# Patient Record
Sex: Female | Born: 1990 | Race: Black or African American | Hispanic: No | Marital: Single | Smoking: Never smoker
Health system: Southern US, Community
[De-identification: ages and names within clinical notes are randomized; demographics above are authoritative.]

## PROBLEM LIST (undated history)

## (undated) DIAGNOSIS — C801 Malignant (primary) neoplasm, unspecified: Secondary | ICD-10-CM

## (undated) HISTORY — PX: SKIN BIOPSY: SHX1

---
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
## 2012-07-03 ENCOUNTER — Emergency Department (HOSPITAL_COMMUNITY)
Admission: EM | Admit: 2012-07-03 | Discharge: 2012-07-03 | Disposition: A | Discharge status: Home / Self Care | Payer: Federal, State, Local not specified - PPO | Attending: Emergency Medicine | Admitting: Emergency Medicine

## 2012-07-03 ENCOUNTER — Encounter (HOSPITAL_COMMUNITY): Payer: Self-pay

## 2012-07-03 DIAGNOSIS — R21 Rash and other nonspecific skin eruption: Secondary | ICD-10-CM | POA: Insufficient documentation

## 2012-07-03 DIAGNOSIS — R0789 Other chest pain: Secondary | ICD-10-CM | POA: Insufficient documentation

## 2012-07-03 DIAGNOSIS — Z85820 Personal history of malignant melanoma of skin: Secondary | ICD-10-CM | POA: Insufficient documentation

## 2012-07-03 DIAGNOSIS — R0602 Shortness of breath: Secondary | ICD-10-CM | POA: Insufficient documentation

## 2012-07-03 DIAGNOSIS — L272 Dermatitis due to ingested food: Secondary | ICD-10-CM | POA: Insufficient documentation

## 2012-07-03 DIAGNOSIS — T7840XA Allergy, unspecified, initial encounter: Secondary | ICD-10-CM

## 2012-07-03 HISTORY — DX: Malignant (primary) neoplasm, unspecified: C80.1

## 2012-07-03 MED ORDER — DIPHENHYDRAMINE HCL 50 MG/ML IJ SOLN
25.0000 mg | Freq: Once | INTRAMUSCULAR | Status: DC
  Filled 2012-07-03: qty 1

## 2012-07-03 MED ORDER — DIPHENHYDRAMINE HCL 50 MG/ML IJ SOLN
50.0000 mg | Freq: Once | INTRAMUSCULAR | Status: AC
  Administered 2012-07-03: 50 mg via INTRAVENOUS

## 2012-07-03 MED ORDER — EPINEPHRINE 0.3 MG/0.3ML IJ DEVI
0.3000 mg | Freq: Once | INTRAMUSCULAR | Status: AC
  Administered 2012-07-03: 0.3 mg via INTRAMUSCULAR
  Filled 2012-07-03: qty 0.3

## 2012-07-03 MED ORDER — FAMOTIDINE IN NACL 20-0.9 MG/50ML-% IV SOLN
20.0000 mg | Freq: Once | INTRAVENOUS | Status: AC
  Administered 2012-07-03: 20 mg via INTRAVENOUS
  Filled 2012-07-03: qty 50

## 2012-07-03 MED ORDER — PREDNISONE 20 MG PO TABS: 40.0000 mg | ORAL_TABLET | Freq: Every day | ORAL | Status: DC

## 2012-07-03 MED ORDER — DIPHENHYDRAMINE HCL 25 MG PO TABS: 25.0000 mg | ORAL_TABLET | Freq: Four times a day (QID) | ORAL | Status: AC

## 2012-07-03 MED ORDER — METHYLPREDNISOLONE SODIUM SUCC 125 MG IJ SOLR
125.0000 mg | Freq: Once | INTRAMUSCULAR | Status: AC
  Administered 2012-07-03: 125 mg via INTRAVENOUS
  Filled 2012-07-03: qty 2

## 2012-07-03 MED ORDER — ONDANSETRON HCL 4 MG/2ML IJ SOLN
4.0000 mg | Freq: Once | INTRAMUSCULAR | Status: AC
  Administered 2012-07-03: 4 mg via INTRAVENOUS
  Filled 2012-07-03: qty 2

## 2012-07-03 NOTE — ED Provider Notes (Signed)
History     CSN: [id number]  Arrival date & time 07/03/12  2040   First MD Initiated Contact with Patient 07/03/12 2045      Chief Complaint  Patient presents with  . Allergic Reaction    (Consider location/radiation/quality/duration/timing/severity/associated sxs/prior treatment) HPI Comments: Patient with history of allergic reaction to peanuts presents with complaint of shortness of breath and tightness in her throat. Patient states that she ate [place] food that contained peanuts last night and she had subsequent hives. She did not have any trouble breathing at that time. She treated herself at home with Benadryl. She continued to feel bad this morning and went to [facility] which gave her additional antihistamines. This evening her breathing became worse and she felt that she needed to come to the hospital for evaluation. She did not use her EpiPen. She has not had any syncopal episodes, vomiting, diarrhea. Her hives are resolved. She has had similar symptoms in the past. Onset acute. Course was gradually worsening. Nothing makes symptoms better or worse.  The history is provided by the patient.    Past Medical History  Diagnosis Date  . Cancer     melanoma    Past Surgical History  Procedure Date  . Skin biopsy     History reviewed. No pertinent family history.  History  Substance Use Topics  . Smoking status: Never Smoker   . Smokeless tobacco: Not on file  . Alcohol Use: Yes    OB History    Grav Para Term Preterm Abortions TAB SAB Ect Mult Living                  Review of Systems  Constitutional: Negative for fever.  HENT: Negative for sore throat, facial swelling, rhinorrhea and trouble swallowing.   Eyes: Negative for redness.  Respiratory: Positive for chest tightness and shortness of breath. Negative for cough, wheezing and stridor.   Cardiovascular: Negative for chest pain.  Gastrointestinal: Negative for nausea, vomiting, abdominal pain and  diarrhea.  Genitourinary: Negative for dysuria.  Musculoskeletal: Negative for myalgias.  Skin: Positive for rash.  Neurological: Negative for light-headedness and headaches.  Psychiatric/Behavioral: Negative for confusion.    Allergies  Peanuts and Soybean-containing drug products  Home Medications   Current Outpatient Rx  Name  Route  Sig  Dispense  Refill  . DIPHENHYDRAMINE HCL (SLEEP) 25 MG PO TABS   Oral   Take 25 mg by mouth at bedtime as needed. For allergies           BP 142/71  Pulse 110  Temp 98 F (36.7 C) (Oral)  Resp 16  SpO2 100%  Physical Exam  Nursing note and vitals reviewed. Constitutional: She appears well-developed and well-nourished.  HENT:  Head: Normocephalic and atraumatic.       No overt swelling of tongue or airway  Eyes: Conjunctivae normal are normal. Right eye exhibits no discharge. Left eye exhibits no discharge.  Neck: Normal range of motion. Neck supple.  Cardiovascular: Normal rate, regular rhythm and normal heart sounds.   Pulmonary/Chest: Breath sounds normal. No stridor. She has no wheezes.       Tachypnea. 2-3 word dyspnea.   Abdominal: Soft. There is no tenderness.  Neurological: She is alert.  Skin: Skin is warm and dry.  Psychiatric: She has a normal mood and affect.    ED Course  Procedures (including critical care time)  Labs Reviewed - No data to display No results found.   1. Allergic  reaction     8:52 PM Patient seen and examined. Patient d/w and seen by Dr. Blinda Leatherwood. Will give epi, antihistamines, solumedrol.    Vital signs reviewed and are as follows: Filed Vitals:   07/03/12 2046  BP: 142/71  Pulse: 110  Temp: 98 F (36.7 C)  Resp: 16   9:25 PM Patient feeling improved after administration of medications.   11:07 PM Patient is at baseline and has remained so. Normal exam. She is comfortable with discharge. Discussed discharge with Dr. Blinda Leatherwood.   Urged return with worsening symptoms, trouble  breathing, shortness of breath or any other concerns. Patient has EpiPen.  MDM  Allergic reaction, questionable anaphylaxis. Patient seemed very anxious and was breathing well. She was given epinephrine and had improvement in symptoms. She is back to baseline. Observed in ED for 2+ hrs. Patient d/w Dr. Blinda Leatherwood prior to discharge.         Renne Crigler, [place] 07/03/12 [phone]

## 2012-07-03 NOTE — ED Notes (Signed)
Pt states she ingested peanuts last night, was taking benadryl, no relief. Pt states her chest became tight and is having trouble breathing. ABCs intact. Pt hyperventilating in waiting room. Pt able to speak in complete sentences.

## 2012-07-03 NOTE — ED Provider Notes (Signed)
Medical screening examination/treatment/procedure(s) were performed by non-physician practitioner and as supervising physician I was immediately available for consultation/collaboration.   Gilda Crease, MD 07/03/12 [phone]

## 2012-07-03 NOTE — ED Notes (Signed)
Pt respirations are now even and unlabored. o2 sats at 100%

## 2012-07-06 ENCOUNTER — Emergency Department (HOSPITAL_COMMUNITY)
Admission: EM | Admit: 2012-07-06 | Discharge: 2012-07-06 | Disposition: A | Discharge status: Home / Self Care | Payer: Federal, State, Local not specified - PPO | Source: Home / Self Care

## 2012-07-06 ENCOUNTER — Encounter (HOSPITAL_COMMUNITY): Payer: Self-pay | Admitting: *Deleted

## 2012-07-06 DIAGNOSIS — T7840XA Allergy, unspecified, initial encounter: Secondary | ICD-10-CM

## 2012-07-06 MED ORDER — CETIRIZINE HCL 10 MG PO TABS: 10.0000 mg | ORAL_TABLET | Freq: Every day | ORAL | Status: AC

## 2012-07-06 MED ORDER — EPINEPHRINE 0.3 MG/0.3ML IJ DEVI: 0.3000 mg | Freq: Once | INTRAMUSCULAR | Status: DC

## 2012-07-06 NOTE — ED Provider Notes (Signed)
Medical screening examination/treatment/procedure(s) were performed by a resident physician and as supervising physician I was immediately available for consultation/collaboration.  Additionally, I saw the patient independently, verified the history, examined the patient and discussed the treatment plan with the resident.  Leslee Home, M.D.    Reuben Likes, MD 07/06/12 [phone]

## 2012-07-06 NOTE — ED Provider Notes (Signed)
History     CSN: [id number]  Arrival date & time 07/06/12  1450 Chief Complaint  Patient presents with  . Allergic Reaction   HPI Micah Flesher to ED on Tuesday after eating [place] Food on Monday and getting hives afterwards without and shortness of breath, tongue or throat swelling ast first. Patient has peanut allergy and food may have contained peanuts though she is not sure.  Patient took benadryl for about a day but ran out and began to feel some tightness in throat and tongue swelling. Patient states her epi pen was expired and she didn't use it. In the ED, given epinephrine, steroid injection, benadryl. Has been on 5 day course of prednisone and benadryl which is to be completed tomorrow.   Patient has been symptom free since starting medication (no chest pain/tightness, tongue or throat swelling, hives). Presents for follow up as instructed by EDP.   Past Medical History  Diagnosis Date  . Cancer     melanoma    Past Surgical History  Procedure Date  . Skin biopsy    Family history-noncontributory  History  Substance Use Topics  . Smoking status: Never Smoker   . Smokeless tobacco: Not on file  . Alcohol Use: Yes    Review of Systems  Constitutional: Negative for chills, activity change and fatigue.  HENT: Negative for hearing loss, ear pain, congestion, facial swelling, rhinorrhea, sneezing, neck pain and neck stiffness.   Eyes: Negative for pain, discharge and itching.  Respiratory: Negative for cough, choking, chest tightness and shortness of breath.   Cardiovascular: Negative for chest pain and palpitations.  Gastrointestinal: Negative for abdominal pain and abdominal distention.  Genitourinary: Negative for dysuria and flank pain.  Musculoskeletal: Negative for back pain and arthralgias.  Skin: Negative for color change, pallor and rash (no more hives).  Neurological: Negative for dizziness and headaches.  Hematological: Negative for adenopathy. Does not bruise/bleed  easily.  Psychiatric/Behavioral: Negative for confusion and agitation.    Allergies  Peanuts and Soybean-containing drug products  Home Medications   Current Outpatient Rx  Name  Route  Sig  Dispense  Refill  . CETIRIZINE HCL 10 MG PO TABS   Oral   Take 1 tablet (10 mg total) by mouth daily.   30 tablet   0   . DIPHENHYDRAMINE HCL 25 MG PO TABS   Oral   Take 1 tablet (25 mg total) by mouth every 6 (six) hours.   20 tablet   0   . EPINEPHRINE 0.3 MG/0.3ML IJ DEVI   Intramuscular   Inject 0.3 mLs (0.3 mg total) into the muscle once.   2 Device   1   . PREDNISONE 20 MG PO TABS   Oral   Take 2 tablets (40 mg total) by mouth daily.   8 tablet   0     BP 125/63  Pulse 90  Temp 98.3 F (36.8 C) (Oral)  Resp 18  SpO2 99%  LMP 06/30/2012  Physical Exam  Constitutional: She is oriented to person, place, and time. She appears well-developed and well-nourished.  HENT:  Head: Normocephalic and atraumatic.  Mouth/Throat: No oropharyngeal exudate.       No tongue or throat swelling   Eyes: Conjunctivae normal and EOM are normal. Pupils are equal, round, and reactive to light.  Neck: Normal range of motion. Neck supple.  Cardiovascular: Normal rate and regular rhythm.  Exam reveals no gallop and no friction rub.   No murmur heard. Pulmonary/Chest: Effort normal  and breath sounds normal. She has no wheezes. She has no rales.  Abdominal: Soft. Bowel sounds are normal.  Musculoskeletal: Normal range of motion. She exhibits no edema.  Neurological: She is alert and oriented to person, place, and time.  Skin: Skin is warm and dry.    ED Course  Procedures (including critical care time)  Labs Reviewed - No data to display No results found.   1. Allergic reaction    MDM  Symptoms have resolved on prednisone taper. Patient last dose prednisone tomorrow. Advised patient of reasons to return/return of symptoms. Refilled EpiPen. ALso given rx for cetirizine which can  replace benadryl for next week to allow her to be more awake with her studies at [employer].   Dr. Lorenz Coaster has seen and evaluated the patient. We have discussed the history, exam, assessment, and plan as noted above. He agrees with management.   Aldine Contes. Marti Sleigh, MD, PGY2 07/06/2012 7:11 PM      Shelva Majestic, MD 07/06/12 1911

## 2012-07-06 NOTE — ED Notes (Signed)
Pt  Reports     She  Had  An allergic  Reaction  sev  Days  Ago      Was  Seen  In  Er  At that  Time      She  Reports  The   Symptoms  Are  Better   yest  She  Reports  Some  Soreness  In  Chest       At  This  Time       She  Is  Here  Today  fopr a  followup      She  Is  Awake  Alert and  Is  In no  Severe  Distress

## 2012-07-11 ENCOUNTER — Emergency Department (HOSPITAL_COMMUNITY)
Admission: EM | Admit: 2012-07-11 | Discharge: 2012-07-11 | Disposition: A | Discharge status: Home / Self Care | Payer: Federal, State, Local not specified - PPO | Source: Home / Self Care | Attending: Family Medicine | Admitting: Family Medicine

## 2012-07-11 ENCOUNTER — Encounter (HOSPITAL_COMMUNITY): Payer: Self-pay | Admitting: Emergency Medicine

## 2012-07-11 DIAGNOSIS — K529 Noninfective gastroenteritis and colitis, unspecified: Secondary | ICD-10-CM

## 2012-07-11 DIAGNOSIS — K5289 Other specified noninfective gastroenteritis and colitis: Secondary | ICD-10-CM

## 2012-07-11 MED ORDER — ONDANSETRON 4 MG PO TBDP
ORAL_TABLET | ORAL | Status: AC
  Filled 2012-07-11: qty 2

## 2012-07-11 MED ORDER — ONDANSETRON 4 MG PO TBDP
8.0000 mg | ORAL_TABLET | Freq: Once | ORAL | Status: AC
  Administered 2012-07-11: 8 mg via ORAL

## 2012-07-11 MED ORDER — GI COCKTAIL ~~LOC~~
30.0000 mL | Freq: Once | ORAL | Status: AC
  Administered 2012-07-11: 30 mL via ORAL

## 2012-07-11 MED ORDER — ONDANSETRON HCL 4 MG PO TABS: 4.0000 mg | ORAL_TABLET | Freq: Four times a day (QID) | ORAL | Status: AC

## 2012-07-11 MED ORDER — GI COCKTAIL ~~LOC~~
ORAL | Status: AC
  Filled 2012-07-11: qty 30

## 2012-07-11 NOTE — ED Notes (Addendum)
Pt is here for poss allergic reaction to meds Was seen at the [facility] ER for allergic reaction to peanuts Was seen here at the [facility] for a f/u on 07/06/12 Was given prednisone and benadryl Pt today reports having episodes since last night of feeling nauseas and sweating Today she has been vomiting; last emesis was around 1400 Had 2 epsiodes of n/v/and sweating today; yest she had 7; will last for 15 minutes Sx include: diarrhea as well, loss of appetite, pressure on chest Denies: SOB, swelling of throat  She is alert w/no signs of acute dsitress

## 2012-07-11 NOTE — ED Provider Notes (Signed)
History     CSN: [id number]  Arrival date & time 07/11/12  1831   First MD Initiated Contact with Patient 07/11/12 1833      Chief Complaint  Patient presents with  . Allergic Reaction    (Consider location/radiation/quality/duration/timing/severity/associated sxs/prior treatment) Patient is a 22 y.o. female presenting with vomiting. The history is provided by the patient.  Emesis  This is a new problem. The current episode started yesterday. The problem occurs 2 to 4 times per day. The problem has been gradually improving (last vomited at 2pm today.also with diarrhea.). There has been no fever. Associated symptoms include diarrhea. Pertinent negatives include no abdominal pain, no chills and no fever.    Past Medical History  Diagnosis Date  . Cancer     melanoma    Past Surgical History  Procedure Date  . Skin biopsy     No family history on file.  History  Substance Use Topics  . Smoking status: Never Smoker   . Smokeless tobacco: Not on file  . Alcohol Use: Yes    OB History    Grav Para Term Preterm Abortions TAB SAB Ect Mult Living                  Review of Systems  Constitutional: Negative for fever and chills.  Gastrointestinal: Positive for vomiting and diarrhea. Negative for abdominal pain.    Allergies  Peanuts and Soybean-containing drug products  Home Medications   Current Outpatient Rx  Name  Route  Sig  Dispense  Refill  . DIPHENHYDRAMINE HCL 25 MG PO TABS   Oral   Take 1 tablet (25 mg total) by mouth every 6 (six) hours.   20 tablet   0   . PREDNISONE 20 MG PO TABS   Oral   Take 2 tablets (40 mg total) by mouth daily.   8 tablet   0   . CETIRIZINE HCL 10 MG PO TABS   Oral   Take 1 tablet (10 mg total) by mouth daily.   30 tablet   0   . EPINEPHRINE 0.3 MG/0.3ML IJ DEVI   Intramuscular   Inject 0.3 mLs (0.3 mg total) into the muscle once.   2 Device   1   . ONDANSETRON HCL 4 MG PO TABS   Oral   Take 1 tablet (4 mg  total) by mouth every 6 (six) hours. For n/v   6 tablet   0     BP 119/74  Pulse 98  Temp 98.3 F (36.8 C) (Oral)  Resp 18  SpO2 98%  LMP 06/30/2012  Physical Exam  Nursing note and vitals reviewed. Constitutional: She is oriented to person, place, and time. She appears well-developed and well-nourished.  HENT:  Mouth/Throat: Oropharynx is clear and moist.  Eyes: Conjunctivae normal are normal. Pupils are equal, round, and reactive to light.  Neck: Normal range of motion. Neck supple.  Cardiovascular: Regular rhythm.   Pulmonary/Chest: Breath sounds normal.  Abdominal: Soft. Bowel sounds are normal.  Lymphadenopathy:    She has no cervical adenopathy.  Neurological: She is alert and oriented to person, place, and time.  Skin: Skin is warm and dry.    ED Course  Procedures (including critical care time)  Labs Reviewed - No data to display No results found.   1. Acute gastroenteritis       MDM          Linna Hoff, MD 07/11/12 2019

## 2013-08-14 ENCOUNTER — Emergency Department (HOSPITAL_COMMUNITY)
Admission: EM | Admit: 2013-08-14 | Discharge: 2013-08-14 | Disposition: A | Discharge status: Home / Self Care | Payer: Federal, State, Local not specified - PPO | Attending: Emergency Medicine | Admitting: Emergency Medicine

## 2013-08-14 ENCOUNTER — Encounter (HOSPITAL_COMMUNITY): Payer: Self-pay | Admitting: Emergency Medicine

## 2013-08-14 DIAGNOSIS — R062 Wheezing: Secondary | ICD-10-CM | POA: Insufficient documentation

## 2013-08-14 DIAGNOSIS — Z79899 Other long term (current) drug therapy: Secondary | ICD-10-CM | POA: Insufficient documentation

## 2013-08-14 DIAGNOSIS — L299 Pruritus, unspecified: Secondary | ICD-10-CM | POA: Insufficient documentation

## 2013-08-14 DIAGNOSIS — Z85820 Personal history of malignant melanoma of skin: Secondary | ICD-10-CM | POA: Insufficient documentation

## 2013-08-14 DIAGNOSIS — T4995XA Adverse effect of unspecified topical agent, initial encounter: Secondary | ICD-10-CM | POA: Insufficient documentation

## 2013-08-14 DIAGNOSIS — T7840XA Allergy, unspecified, initial encounter: Secondary | ICD-10-CM

## 2013-08-14 MED ORDER — FAMOTIDINE IN NACL 20-0.9 MG/50ML-% IV SOLN
20.0000 mg | Freq: Once | INTRAVENOUS | Status: AC
  Administered 2013-08-14: 20 mg via INTRAVENOUS
  Filled 2013-08-14: qty 50

## 2013-08-14 MED ORDER — DIPHENHYDRAMINE HCL 50 MG/ML IJ SOLN
25.0000 mg | Freq: Once | INTRAMUSCULAR | Status: AC
  Administered 2013-08-14: 25 mg via INTRAVENOUS
  Filled 2013-08-14: qty 1

## 2013-08-14 MED ORDER — SODIUM CHLORIDE 0.9 % IV BOLUS (SEPSIS)
1000.0000 mL | Freq: Once | INTRAVENOUS | Status: AC
  Administered 2013-08-14: 1000 mL via INTRAVENOUS

## 2013-08-14 MED ORDER — EPINEPHRINE 0.3 MG/0.3ML IJ SOAJ
INTRAMUSCULAR | Status: AC
  Filled 2013-08-14: qty 0.3

## 2013-08-14 MED ORDER — METHYLPREDNISOLONE SODIUM SUCC 125 MG IJ SOLR
125.0000 mg | Freq: Once | INTRAMUSCULAR | Status: AC
  Administered 2013-08-14: 125 mg via INTRAVENOUS
  Filled 2013-08-14: qty 2

## 2013-08-14 MED ORDER — FAMOTIDINE 20 MG PO TABS: 20.0000 mg | ORAL_TABLET | Freq: Two times a day (BID) | ORAL | Status: DC

## 2013-08-14 MED ORDER — PREDNISONE 50 MG PO TABS: 50.0000 mg | ORAL_TABLET | Freq: Every day | ORAL | Status: DC

## 2013-08-14 NOTE — ED Notes (Signed)
Friend stated, A friend opened a bag of peanuts and she is allergic severely of peanuts.  Started breathing heavy and becoming SOB.  Pt. Breathing hard with big deep breaths. Given Benadryl 25mg . About 30 minutes ago.

## 2013-08-14 NOTE — ED Provider Notes (Signed)
CSN: [id number]     Arrival date & time 08/14/13  1755 History   First MD Initiated Contact with Patient 08/14/13 1839     Chief Complaint  Patient presents with  . Shortness of Breath  . Allergic Reaction     (Consider location/radiation/quality/duration/timing/severity/associated sxs/prior Treatment) HPI Patient presents to the emergency department with possible allergic reaction to peanut percent, which the patient, states she has appeared, allergy, and a coworker Z. appear butter sandwich and when she got around a coworker she started having heavy breathing, and felt like her face was itchy patient, states, that she took Benadryl prior to arrival.  Patient did not contact her eating peanuts accident.  She denies chest pain, shortness of breath, nausea, vomiting, diarrhea, back pain, neck swelling, fever, weakness, dizziness, throat, swelling, or rash. Past Medical History  Diagnosis Date  . Cancer     melanoma   Past Surgical History  Procedure Laterality Date  . Skin biopsy     No family history on file. History  Substance Use Topics  . Smoking status: Never Smoker   . Smokeless tobacco: Not on file  . Alcohol Use: Yes   OB History   Grav Para Term Preterm Abortions TAB SAB Ect Mult Living                 Review of Systems  All other systems negative except as documented in the HPI. All pertinent positives and negatives as reviewed in the HPI.  Allergies  Peanuts and Soybean-containing drug products  Home Medications   Current Outpatient Rx  Name  Route  Sig  Dispense  Refill  . cetirizine (ZYRTEC) 10 MG tablet   Oral   Take 1 tablet (10 mg total) by mouth daily.   30 tablet   0   . diphenhydrAMINE (BENADRYL) 25 MG tablet   Oral   Take 1 tablet (25 mg total) by mouth every 6 (six) hours.   20 tablet   0   . EPINEPHrine (EPI-PEN) 0.3 mg/0.3 mL DEVI   Intramuscular   Inject 0.3 mLs (0.3 mg total) into the muscle once.   2 Device   1   . ondansetron  (ZOFRAN) 4 MG tablet   Oral   Take 1 tablet (4 mg total) by mouth every 6 (six) hours. For n/v   6 tablet   0    BP 115/59  Pulse 104  SpO2 100%  LMP 08/07/2013 Physical Exam  Nursing note and vitals reviewed. Constitutional: She is oriented to person, place, and time. She appears well-developed and well-nourished.  HENT:  Head: Normocephalic and atraumatic.  Mouth/Throat: Uvula is midline and oropharynx is clear and moist. No trismus in the jaw. No uvula swelling.  Eyes: Pupils are equal, round, and reactive to light.  Neck: Normal range of motion. Neck supple.  Cardiovascular: Normal rate, regular rhythm and normal heart sounds.  Exam reveals no gallop and no friction rub.   No murmur heard. Pulmonary/Chest: Effort normal and breath sounds normal.  Neurological: She is alert and oriented to person, place, and time. She exhibits normal muscle tone. Coordination normal.  Skin: Skin is warm and dry.    ED Course  Procedures (including critical care time) Patient is able to speak in full sentences without difficulty.  The patient is a have any wheezing.  On exam.  Her throat is now swollen.  Uvula is midline, without swelling.  I did order the patient.  Solu-Medrol, Benadryl, and Pepcid. The  patient ambulated to the bathroom without difficulty.  Patient, hyperventilating on arrival and making of upper respiratory sounds.  There is no actual wheezing noted.  Patient is feeling totally resolved. Will discharge and will have her return here as needed.   Brent General, PA-C 08/14/13 2034

## 2013-08-14 NOTE — ED Notes (Signed)
Pt states she was eating near someone eating and peanut butter and jelly when she began feeling like she was having an allergic reaction. Pt appears anxious and with exaggerated breathing when talking about allergic reaction. Breath sounds clear, O2 sats 100% on room air.

## 2013-08-14 NOTE — Discharge Instructions (Signed)
Return here as needed. Follow up with your doctor. °

## 2013-08-14 NOTE — ED Provider Notes (Signed)
Medical screening examination/treatment/procedure(s) were performed by non-physician practitioner and as supervising physician I was immediately available for consultation/collaboration.   EKG Interpretation None        Wandra Arthurs, MD 08/14/13 2059

## 2014-04-09 ENCOUNTER — Emergency Department (HOSPITAL_COMMUNITY)
Admission: EM | Admit: 2014-04-09 | Discharge: 2014-04-09 | Disposition: A | Discharge status: Home / Self Care | Payer: Federal, State, Local not specified - PPO | Attending: Emergency Medicine | Admitting: Emergency Medicine

## 2014-04-09 ENCOUNTER — Encounter (HOSPITAL_COMMUNITY): Payer: Self-pay

## 2014-04-09 DIAGNOSIS — Z79899 Other long term (current) drug therapy: Secondary | ICD-10-CM | POA: Insufficient documentation

## 2014-04-09 DIAGNOSIS — Z7952 Long term (current) use of systemic steroids: Secondary | ICD-10-CM | POA: Diagnosis not present

## 2014-04-09 DIAGNOSIS — T7840XA Allergy, unspecified, initial encounter: Secondary | ICD-10-CM

## 2014-04-09 DIAGNOSIS — R0602 Shortness of breath: Secondary | ICD-10-CM | POA: Insufficient documentation

## 2014-04-09 MED ORDER — EPINEPHRINE 0.3 MG/0.3ML IJ SOAJ
0.3000 mg | Freq: Once | INTRAMUSCULAR | Status: AC
  Administered 2014-04-09: 0.3 mg via INTRAMUSCULAR

## 2014-04-09 MED ORDER — DIPHENHYDRAMINE HCL 50 MG/ML IJ SOLN
INTRAMUSCULAR | Status: AC
  Filled 2014-04-09: qty 1

## 2014-04-09 MED ORDER — METHYLPREDNISOLONE SODIUM SUCC 125 MG IJ SOLR
INTRAMUSCULAR | Status: AC
  Filled 2014-04-09: qty 2

## 2014-04-09 MED ORDER — FAMOTIDINE IN NACL 20-0.9 MG/50ML-% IV SOLN: 20.0000 mg | Freq: Once | INTRAVENOUS | Status: DC

## 2014-04-09 MED ORDER — EPINEPHRINE 0.3 MG/0.3ML IJ DEVI: 0.3000 mg | Freq: Once | INTRAMUSCULAR | Status: AC

## 2014-04-09 MED ORDER — PREDNISONE 50 MG PO TABS: 50.0000 mg | ORAL_TABLET | Freq: Every day | ORAL | Status: AC

## 2014-04-09 MED ORDER — FAMOTIDINE IN NACL 20-0.9 MG/50ML-% IV SOLN
20.0000 mg | Freq: Once | INTRAVENOUS | Status: AC
  Administered 2014-04-09: 20 mg via INTRAVENOUS
  Filled 2014-04-09: qty 50

## 2014-04-09 MED ORDER — DIPHENHYDRAMINE HCL 50 MG/ML IJ SOLN
50.0000 mg | Freq: Once | INTRAMUSCULAR | Status: AC
  Administered 2014-04-09: 50 mg via INTRAVENOUS

## 2014-04-09 MED ORDER — METHYLPREDNISOLONE SODIUM SUCC 125 MG IJ SOLR
125.0000 mg | Freq: Once | INTRAMUSCULAR | Status: AC
Start: 2014-04-09 — End: 2014-04-09
  Administered 2014-04-09: 125 mg via INTRAVENOUS

## 2014-04-09 MED ORDER — EPINEPHRINE 0.3 MG/0.3ML IJ SOAJ
INTRAMUSCULAR | Status: AC
  Filled 2014-04-09: qty 0.3

## 2014-04-09 NOTE — Discharge Instructions (Signed)

## 2014-04-09 NOTE — ED Notes (Signed)
MD at bedside. 

## 2014-04-09 NOTE — ED Provider Notes (Signed)
CSN: [id number]     Arrival date & time 04/09/14  1839 History   First MD Initiated Contact with Patient 04/09/14 1845     Chief Complaint  Patient presents with  . Allergic Reaction     (Consider location/radiation/quality/duration/timing/severity/associated sxs/prior Treatment) HPI Comments: Patient here complaining of sudden onset of shortness of breath that started after she was exposed to peanuts. She does have a known allergy to all peanuts and legumes. She took an unknown amount of Benadryl and continues to have wheezing as well as stridor. No rash noted. Called EMS and transported here.  Patient is a 23 y.o. female presenting with allergic reaction. The history is provided by the patient and the EMS personnel.  Allergic Reaction   Past Medical History  Diagnosis Date  . Cancer     melanoma   Past Surgical History  Procedure Laterality Date  . Skin biopsy     No family history on file. History  Substance Use Topics  . Smoking status: Never Smoker   . Smokeless tobacco: Not on file  . Alcohol Use: Yes   OB History    No data available     Review of Systems  All other systems reviewed and are negative.     Allergies  Peanuts and Soybean-containing drug products  Home Medications   Prior to Admission medications   Medication Sig Start Date End Date Taking? Authorizing Provider  cetirizine (ZYRTEC) 10 MG tablet Take 1 tablet (10 mg total) by mouth daily. 07/06/12   Marin Olp, MD  diphenhydrAMINE (BENADRYL) 25 MG tablet Take 1 tablet (25 mg total) by mouth every 6 (six) hours. 07/03/12   Carlisle Cater, PA-C  EPINEPHrine (EPI-PEN) 0.3 mg/0.3 mL DEVI Inject 0.3 mLs (0.3 mg total) into the muscle once. 07/06/12   Marin Olp, MD  famotidine (PEPCID) 20 MG tablet Take 1 tablet (20 mg total) by mouth 2 (two) times daily. 08/14/13   [place], PA-C  ondansetron (ZOFRAN) 4 MG tablet Take 1 tablet (4 mg total) by mouth every 6 (six) hours. For n/v  07/11/12   Billy Fischer, MD  predniSONE (DELTASONE) 50 MG tablet Take 1 tablet (50 mg total) by mouth daily. 08/14/13   Brent General, PA-C   There were no vitals taken for this visit. Physical Exam  Constitutional: She is oriented to person, place, and time. She appears well-developed and well-nourished.  Non-toxic appearance. No distress.  HENT:  Head: Normocephalic and atraumatic.  Eyes: Conjunctivae, EOM and lids are normal. Pupils are equal, round, and reactive to light.  Neck: Normal range of motion. Neck supple. No tracheal deviation present. No thyroid mass present.  Cardiovascular: Normal rate, regular rhythm and normal heart sounds.  Exam reveals no gallop.   No murmur heard. Pulmonary/Chest: Effort normal and breath sounds normal. Stridor present. No respiratory distress. She has no decreased breath sounds. She has no wheezes. She has no rhonchi. She has no rales.  Abdominal: Soft. Normal appearance and bowel sounds are normal. She exhibits no distension. There is no tenderness. There is no rebound and no CVA tenderness.  Musculoskeletal: Normal range of motion. She exhibits no edema or tenderness.  Neurological: She is alert and oriented to person, place, and time. She has normal strength. No cranial nerve deficit or sensory deficit. GCS eye subscore is 4. GCS verbal subscore is 5. GCS motor subscore is 6.  Skin: Skin is warm and dry. No abrasion and no rash noted.  Psychiatric: She has a normal mood and affect. Her speech is normal and behavior is normal.  Nursing note and vitals reviewed.   ED Course  Procedures (including critical care time) Labs Review Labs Reviewed - No data to display  Imaging Review No results found.   EKG Interpretation None      MDM   Final diagnoses:  None     patient given treatment for allergic reaction here. Feels better.monitored here and no signs of rebound allergic reaction.stable for d/c    Regina Jacobsen, MD 04/09/14  2059

## 2014-04-09 NOTE — ED Notes (Signed)
Pt was potentially exposed to peanuts about 15 minutes ago with known allergy, she usually has epipen but did not have it today. Pt feels though she has swelling in the mouth and throat.

## 2014-04-09 NOTE — ED Notes (Signed)
Pt monitored by pulse ox, bp cuff, and 12-lead. 

## 2014-04-09 NOTE — ED Notes (Signed)
Pt breathing WNL. Airway patent. Pt alert and oriented. Pt states she feels much better

## 2014-04-13 ENCOUNTER — Encounter (HOSPITAL_COMMUNITY): Payer: Self-pay | Admitting: Oncology

## 2014-04-13 ENCOUNTER — Emergency Department (HOSPITAL_COMMUNITY)
Admission: EM | Admit: 2014-04-13 | Discharge: 2014-04-13 | Disposition: A | Discharge status: Home / Self Care | Payer: Federal, State, Local not specified - PPO | Attending: Emergency Medicine | Admitting: Emergency Medicine

## 2014-04-13 ENCOUNTER — Emergency Department (HOSPITAL_COMMUNITY): Payer: Federal, State, Local not specified - PPO

## 2014-04-13 DIAGNOSIS — Z3202 Encounter for pregnancy test, result negative: Secondary | ICD-10-CM | POA: Insufficient documentation

## 2014-04-13 DIAGNOSIS — Y92321 Football field as the place of occurrence of the external cause: Secondary | ICD-10-CM | POA: Insufficient documentation

## 2014-04-13 DIAGNOSIS — T1490XA Injury, unspecified, initial encounter: Secondary | ICD-10-CM

## 2014-04-13 DIAGNOSIS — W500XXA Accidental hit or strike by another person, initial encounter: Secondary | ICD-10-CM | POA: Diagnosis not present

## 2014-04-13 DIAGNOSIS — Z79899 Other long term (current) drug therapy: Secondary | ICD-10-CM | POA: Diagnosis not present

## 2014-04-13 DIAGNOSIS — Y998 Other external cause status: Secondary | ICD-10-CM | POA: Diagnosis not present

## 2014-04-13 DIAGNOSIS — Z85820 Personal history of malignant melanoma of skin: Secondary | ICD-10-CM | POA: Insufficient documentation

## 2014-04-13 DIAGNOSIS — S0990XA Unspecified injury of head, initial encounter: Secondary | ICD-10-CM | POA: Insufficient documentation

## 2014-04-13 DIAGNOSIS — Y9389 Activity, other specified: Secondary | ICD-10-CM | POA: Insufficient documentation

## 2014-04-13 DIAGNOSIS — Z7952 Long term (current) use of systemic steroids: Secondary | ICD-10-CM | POA: Diagnosis not present

## 2014-04-13 LAB — POC URINE PREG, ED: PREG TEST UR: NEGATIVE

## 2014-04-13 MED ORDER — IBUPROFEN 800 MG PO TABS: 800.0000 mg | ORAL_TABLET | Freq: Three times a day (TID) | ORAL | Status: AC

## 2014-04-13 MED ORDER — ONDANSETRON 8 MG PO TBDP
8.0000 mg | ORAL_TABLET | Freq: Once | ORAL | Status: AC
  Administered 2014-04-13: 8 mg via ORAL
  Filled 2014-04-13: qty 1
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
## 2015-10-16 ENCOUNTER — Encounter (HOSPITAL_COMMUNITY): Payer: Self-pay | Admitting: *Deleted

## 2015-10-16 ENCOUNTER — Emergency Department (HOSPITAL_COMMUNITY)
Admission: EM | Admit: 2015-10-16 | Discharge: 2015-10-16 | Disposition: A | Discharge status: Home / Self Care | Payer: Federal, State, Local not specified - PPO | Attending: Emergency Medicine | Admitting: Emergency Medicine

## 2015-10-16 DIAGNOSIS — T781XXA Other adverse food reactions, not elsewhere classified, initial encounter: Secondary | ICD-10-CM | POA: Diagnosis not present

## 2015-10-16 DIAGNOSIS — Z91011 Allergy to milk products: Secondary | ICD-10-CM | POA: Insufficient documentation

## 2015-10-16 DIAGNOSIS — Z91010 Allergy to peanuts: Secondary | ICD-10-CM | POA: Insufficient documentation

## 2015-10-16 DIAGNOSIS — Z79899 Other long term (current) drug therapy: Secondary | ICD-10-CM | POA: Diagnosis not present

## 2015-10-16 DIAGNOSIS — Z7952 Long term (current) use of systemic steroids: Secondary | ICD-10-CM | POA: Diagnosis not present

## 2015-10-16 DIAGNOSIS — Z791 Long term (current) use of non-steroidal anti-inflammatories (NSAID): Secondary | ICD-10-CM | POA: Diagnosis not present

## 2015-10-16 DIAGNOSIS — Z85828 Personal history of other malignant neoplasm of skin: Secondary | ICD-10-CM | POA: Diagnosis not present

## 2015-10-16 DIAGNOSIS — T7840XA Allergy, unspecified, initial encounter: Secondary | ICD-10-CM

## 2015-10-16 MED ORDER — FAMOTIDINE IN NACL 20-0.9 MG/50ML-% IV SOLN
20.0000 mg | Freq: Once | INTRAVENOUS | Status: AC
  Administered 2015-10-16: 20 mg via INTRAVENOUS
  Filled 2015-10-16: qty 50

## 2015-10-16 MED ORDER — SODIUM CHLORIDE 0.9 % IV BOLUS (SEPSIS)
1000.0000 mL | Freq: Once | INTRAVENOUS | Status: AC
  Administered 2015-10-16: 1000 mL via INTRAVENOUS

## 2015-10-16 MED ORDER — PREDNISONE 10 MG PO TABS: 20.0000 mg | ORAL_TABLET | Freq: Every day | ORAL | Status: AC

## 2015-10-16 MED ORDER — DIPHENHYDRAMINE HCL 25 MG PO TABS: 25.0000 mg | ORAL_TABLET | Freq: Four times a day (QID) | ORAL | Status: AC | PRN

## 2015-10-16 MED ORDER — EPINEPHRINE 0.3 MG/0.3ML IJ SOAJ: 0.3000 mg | Freq: Once | INTRAMUSCULAR | Status: AC

## 2015-10-16 MED ORDER — LORATADINE 10 MG PO TABS: 10.0000 mg | ORAL_TABLET | Freq: Every day | ORAL | Status: AC

## 2015-10-16 MED ORDER — PREDNISONE 20 MG PO TABS
60.0000 mg | ORAL_TABLET | Freq: Once | ORAL | Status: AC
  Administered 2015-10-16: 60 mg via ORAL
  Filled 2015-10-16: qty 3

## 2015-10-16 NOTE — ED Notes (Signed)
Pt with allergic reaction to peanuts had a shake with peanut butter in it. Has used epi pen and 50mg  of benadryl. Alert and oriented. Airway secure

## 2015-10-16 NOTE — Discharge Instructions (Signed)

## 2015-10-16 NOTE — ED Notes (Signed)
Pt states "I took 2 benadryl and used my Epipen."

## 2015-10-16 NOTE — ED Notes (Signed)
Bed: WA03 Expected date:  Expected time:  Means of arrival:  Comments: EMS 25 yo female from home/allergy to peanuts/epi pen and 50 mg benadryl/scratchy throat

## 2015-10-16 NOTE — ED Provider Notes (Signed)
CSN: [id number]     Arrival date & time 10/16/15  0043 History   First MD Initiated Contact with Patient 10/16/15 0135     Chief Complaint  Patient presents with  . 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 She was going to eat her boyfriends milk shake and didn't realize there was peanuts in it. Her tongue started to swell immediately and she got her epi pen and called EMS. By the time EMS got to the patient she was still having some swelling to her mouth and some difficulty breathing. They gave her 50 mg Benadryl and fluids then the symptoms resolved. She denies ever needing to be intubated. No vomiting or fever.  PCP: No primary care provider on file. Beverlee Bongard is a 25 y.o.  female  ROS: The patient denies diaphoresis, fever, headache, weakness (general or focal), confusion, change of vision,  aphagia, shortness of breath,  abdominal pains, vomiting, diarrhea, lower extremity swelling, rash, neck pain, chest pain  Past Medical History  Diagnosis Date  . Cancer ([place])     melanoma   Past Surgical History  Procedure Laterality Date  . Skin biopsy     History reviewed. No pertinent family history. Social History  Substance Use Topics  . Smoking status: Never Smoker   . Smokeless tobacco: None  . Alcohol Use: Yes   OB History    No data available     Review of Systems  .Review of Systems All other systems negative except as documented in the HPI. All pertinent positives and negatives as reviewed in the HPI.   Allergies  Peanuts and Soybean-containing drug products  Home Medications   Prior to Admission medications   Medication Sig Start Date End Date Taking? Authorizing Provider  cetirizine (ZYRTEC) 10 MG tablet Take 1 tablet (10 mg total) by mouth daily. Patient taking differently: Take 10 mg by mouth daily as needed for allergies.  07/06/12   Yes Marin Olp, MD  diphenhydrAMINE (BENADRYL) 25 MG tablet Take 1 tablet (25 mg total) by mouth every 6 (six) hours. Patient taking differently: Take 50 mg by mouth every 6 (six) hours as needed for allergies. Allergic reaction 07/03/12  Yes Carlisle Cater, PA-C  EPINEPHrine (EPI-PEN) 0.3 mg/0.3 mL DEVI Inject 0.3 mLs (0.3 mg total) into the muscle once. 04/09/14  Yes Lacretia Leigh, MD  Multiple Vitamin (MULTIVITAMIN WITH MINERALS) TABS tablet Take 1 tablet by mouth daily.   Yes Historical Provider, MD  diphenhydrAMINE (BENADRYL) 25 MG tablet Take 1 tablet (25 mg total) by mouth every 6 (six) hours as needed. 10/16/15   Jarell Mcewen Carlota Raspberry, PA-C  EPINEPHrine 0.3 mg/0.3 mL IJ SOAJ injection Inject 0.3 mLs (0.3 mg total) into the muscle once. 10/16/15   Leland Raver Carlota Raspberry, PA-C  ibuprofen (ADVIL,MOTRIN) 800 MG tablet Take 1 tablet (800 mg total) by mouth 3 (three) times daily. 04/13/14   April Palumbo, MD  loratadine (CLARITIN) 10 MG tablet Take 1 tablet (10 mg total) by mouth daily. 10/16/15   Trelon Plush Carlota Raspberry, PA-C  ondansetron (ZOFRAN) 4 MG tablet Take 1 tablet (4 mg total) by mouth every 6 (six) hours. For n/v 07/11/12   Billy Fischer, MD  predniSONE (DELTASONE) 10 MG tablet Take 2 tablets (20 mg total) by mouth daily. 10/16/15   Moksha Dorgan Carlota Raspberry, PA-C  predniSONE (DELTASONE) 50 MG tablet Take 1 tablet (50 mg total) by mouth daily. 04/09/14   Lacretia Leigh,  MD   BP 119/58 mmHg  Pulse 91  Temp(Src) 98.2 F (36.8 C) (Oral)  Resp 21  SpO2 99%  LMP 10/16/2015 (Exact Date) Physical Exam  Constitutional: She appears well-developed and well-nourished. No distress.  HENT:  Head: Normocephalic and atraumatic.  Right Ear: Tympanic membrane and ear canal normal.  Left Ear: Tympanic membrane and ear canal normal.  Nose: Nose normal.  Mouth/Throat: Uvula is midline, oropharynx is clear and moist and mucous membranes are normal.  Eyes: Pupils are equal, round, and reactive to light.  Neck: Normal range of motion.  Neck supple.  Cardiovascular: Normal rate and regular rhythm.   Pulmonary/Chest: Effort normal and breath sounds normal. She has no decreased breath sounds. She has no wheezes. She has no rhonchi.  Abdominal: Soft.  No signs of abdominal distention  Musculoskeletal:  No LE swelling  Neurological: She is alert.  Acting at baseline  Skin: Skin is warm and dry. No rash noted.  Nursing note and vitals reviewed.   ED Course  Procedures (including critical care time) Labs Review Labs Reviewed - No data to display  Imaging Review No results found. I have personally reviewed and evaluated these images and lab results as part of my medical decision-making.   EKG Interpretation None      MDM   Final diagnoses:  Allergic reaction, initial encounter    Patient re-evaluated prior to dc, is hemodynamically stable, in no respiratory distress, and denies the feeling of throat closing, normal phonation. No wheezing, no vomiting, no syncope. Discussed signs and symptoms of anaphylaxis and severe allergic reaction. Pt advised to return for any worsening in symptoms or any concerns. Pt treated with Benadryl, Zyrtec, prednisone, refill of EpiPen. Pt is to follow up with their PCP. Pt is agreeable with plan & verbalizes understanding.   Medications  sodium chloride 0.9 % bolus 1,000 mL (0 mLs Intravenous Stopped 10/16/15 0401)  famotidine (PEPCID) IVPB 20 mg premix (0 mg Intravenous Stopped 10/16/15 0246)  predniSONE (DELTASONE) tablet 60 mg (60 mg Oral Given 10/16/15 0210)    I discussed results, diagnoses and plan with Katlyn Abts. They voice there understanding and questions were answered. We discussed follow-up recommendations and return precautions.    Delos Haring, PA-C [id number]  Delora Fuel, MD [id number] [phone]

## 2015-10-16 NOTE — ED Notes (Signed)
Pt remains on telemetry
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
